# Patient Record
Sex: Male | Born: 1955 | Race: Black or African American | Hispanic: No | Marital: Married
Health system: Southern US, Community
[De-identification: ages and names within clinical notes are randomized; demographics above are authoritative.]

---

## 2020-08-25 ENCOUNTER — Other Ambulatory Visit: Payer: Self-pay

## 2020-08-25 ENCOUNTER — Ambulatory Visit (INDEPENDENT_AMBULATORY_CARE_PROVIDER_SITE_OTHER): Payer: Medicare Other

## 2020-08-25 ENCOUNTER — Encounter: Payer: Self-pay | Admitting: Podiatry

## 2020-08-25 ENCOUNTER — Ambulatory Visit (INDEPENDENT_AMBULATORY_CARE_PROVIDER_SITE_OTHER): Payer: Medicare Other | Admitting: Podiatry

## 2020-08-25 DIAGNOSIS — R52 Pain, unspecified: Secondary | ICD-10-CM

## 2020-08-25 DIAGNOSIS — M7662 Achilles tendinitis, left leg: Secondary | ICD-10-CM

## 2020-08-25 NOTE — Progress Notes (Signed)
Subjective:  Patient ID: Brad Anderson, male    DOB: [birth year],  MRN: [id number]  Chief Complaint  Patient presents with  . Foot Pain    pt states he had pain within left heel and heel for 2-3 months    64 y.o. male presents with the above complaint.  Patient presents with complaint of left posterior heel pain that has been on for 2 to 3 months as progressing on worse.  Patient's tried soaking it cream but none of that has helped.  He states that is very painful to walk on.  Pain on palpation.  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  He has not seen anyone else prior to seeing me.  He denies any other acute complaints.  His pain scale is 10 out of 10.   Review of Systems: Negative except as noted in the HPI. Denies N/V/F/Ch.  No past medical history on file.  Current Outpatient Medications:  .  aspirin 81 MG EC tablet, Take by mouth., Disp: , Rfl:  .  atorvastatin (LIPITOR) 40 MG tablet, Take by mouth., Disp: , Rfl:  .  baclofen (LIORESAL) 10 MG tablet, Take 1 tablet by mouth 3 (three) times daily., Disp: , Rfl:  .  Cholecalciferol 25 MCG (1000 UT) capsule, Take by mouth., Disp: , Rfl:  .  diclofenac (VOLTAREN) 75 MG EC tablet, , Disp: , Rfl:  .  erythromycin ophthalmic ointment, , Disp: , Rfl:  .  glipiZIDE (GLUCOTROL) 10 MG tablet, , Disp: , Rfl:  .  ibuprofen (ADVIL) 800 MG tablet, , Disp: , Rfl:  .  lisinopril (ZESTRIL) 2.5 MG tablet, , Disp: , Rfl:  .  methocarbamol (ROBAXIN) 500 MG tablet, , Disp: , Rfl:  .  pantoprazole (PROTONIX) 40 MG tablet, Take by mouth., Disp: , Rfl:  .  pioglitazone (ACTOS) 15 MG tablet, Take by mouth., Disp: , Rfl:  .  promethazine-dextromethorphan (PROMETHAZINE-DM) 6.25-15 MG/5ML syrup, , Disp: , Rfl:  .  traMADol (ULTRAM) 50 MG tablet, Take by mouth., Disp: , Rfl:   Social History   Tobacco Use  Smoking Status Not on file    Not on File Objective:  There were no vitals filed for this visit. There is no  height or weight on file to calculate BMI. Constitutional Well developed. Well nourished.  Vascular Dorsalis pedis pulses palpable bilaterally. Posterior tibial pulses palpable bilaterally. Capillary refill normal to all digits.  No cyanosis or clubbing noted. Pedal hair growth normal.  Neurologic Normal speech. Oriented to person, place, and time. Epicritic sensation to light touch grossly present bilaterally.  Dermatologic Nails well groomed and normal in appearance. No open wounds. No skin lesions.  Orthopedic:  Pain on palpation to the Achilles insertion.  Pain with dorsiflexion of the ankle joint no pain with plantarflexion of the ankle joint active and passive.  No pain at the peroneal tendon, posterior tibial tendon, ATFL ligament.   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  No osseous fractures noted.  No other bony abnormalities identified. Assessment:   1. Achilles tendinitis, left leg    Plan:  Patient was evaluated and treated and all questions answered.  Left Achilles tendinitis with underlying posterior heel spurring -I explained to patient the etiology of the Achilles tendinitis and various treatment options were discussed.  I believe patient will benefit from cam boot immobilization with a steroid injection to help decrease acute inflammatory component associated  pain.  I discussed with the patient that given that this is good to be in the Kager's fat pad injection there is a risk of rupture associated with it.  Patient would like to proceed despite the risks. -A steroid injection was performed at left Kager's fat pad using 1% plain Lidocaine and 10 mg of Kenalog. This was well tolerated. -He will also benefit from cam boot immobilization.  If there is no improvement we will discuss getting an MRI for possible surgical intervention given the size of the posterior scarring.   No  follow-ups on file.

## 2020-09-22 ENCOUNTER — Encounter: Payer: Self-pay | Admitting: Podiatry

## 2020-09-22 ENCOUNTER — Ambulatory Visit (INDEPENDENT_AMBULATORY_CARE_PROVIDER_SITE_OTHER): Payer: Medicare Other | Admitting: Podiatry

## 2020-09-22 ENCOUNTER — Other Ambulatory Visit: Payer: Self-pay

## 2020-09-22 DIAGNOSIS — M7662 Achilles tendinitis, left leg: Secondary | ICD-10-CM | POA: Diagnosis not present

## 2020-09-22 NOTE — Progress Notes (Signed)
Subjective:  Patient ID: Brad Anderson, male    DOB: 1956-05-20,  MRN: [id number]  Chief Complaint  Patient presents with  . Foot Pain    PT stated that he is doing okay he has no major concerns at this time. He is still having a little bit of pain. He would like another injection     64 y.o. male presents with the above complaint.  Patient presents with a follow-up of left Achilles tendinitis.  Patient states is doing about 80% better.  He would like to know if he can do another steroid injection.  He has been wearing the boot.  He denies any other acute complaints.  Review of Systems: Negative except as noted in the HPI. Denies N/V/F/Ch.  No past medical history on file.  Current Outpatient Medications:  .  aspirin 81 MG EC tablet, Take by mouth., Disp: , Rfl:  .  atorvastatin (LIPITOR) 40 MG tablet, Take by mouth., Disp: , Rfl:  .  baclofen (LIORESAL) 10 MG tablet, Take 1 tablet by mouth 3 (three) times daily., Disp: , Rfl:  .  Cholecalciferol 25 MCG (1000 UT) capsule, Take by mouth., Disp: , Rfl:  .  diclofenac (VOLTAREN) 75 MG EC tablet, , Disp: , Rfl:  .  erythromycin ophthalmic ointment, , Disp: , Rfl:  .  glipiZIDE (GLUCOTROL) 10 MG tablet, , Disp: , Rfl:  .  ibuprofen (ADVIL) 800 MG tablet, , Disp: , Rfl:  .  lisinopril (ZESTRIL) 2.5 MG tablet, , Disp: , Rfl:  .  methocarbamol (ROBAXIN) 500 MG tablet, , Disp: , Rfl:  .  pantoprazole (PROTONIX) 40 MG tablet, Take by mouth., Disp: , Rfl:  .  pioglitazone (ACTOS) 15 MG tablet, Take by mouth., Disp: , Rfl:  .  promethazine-dextromethorphan (PROMETHAZINE-DM) 6.25-15 MG/5ML syrup, , Disp: , Rfl:  .  traMADol (ULTRAM) 50 MG tablet, Take by mouth., Disp: , Rfl:   Social History   Tobacco Use  Smoking Status Not on file  Smokeless Tobacco Not on file    Not on File Objective:  There were no vitals filed for this visit. There is no height or weight on file to calculate BMI. Constitutional Well developed. Well  nourished.  Vascular Dorsalis pedis pulses palpable bilaterally. Posterior tibial pulses palpable bilaterally. Capillary refill normal to all digits.  No cyanosis or clubbing noted. Pedal hair growth normal.  Neurologic Normal speech. Oriented to person, place, and time. Epicritic sensation to light touch grossly present bilaterally.  Dermatologic Nails well groomed and normal in appearance. No open wounds. No skin lesions.  Orthopedic:  Mild pain on palpation to the Achilles insertion.  Mild pain with dorsiflexion of the ankle joint no pain with plantarflexion of the ankle joint active and passive.  No pain at the peroneal tendon, posterior tibial tendon, ATFL ligament.   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  No osseous fractures noted.  No other bony abnormalities identified. Assessment:   No diagnosis found. Plan:  Patient was evaluated and treated and all questions answered.  Left Achilles tendinitis with underlying posterior heel spurring -Clinically improved with 1 steroid injection in cam boot immobilization.  At this point I instructed patient to start transitioning to regular shoes with Tri-Lock ankle brace.  I discussed with the patient that if he continues to have residual pain we can discuss surgical option as well as another steroid injection in 4 weeks.  Patient agrees with the  plan and would like to proceed with the transition with Tri-Lock brace. -Tri-Lock brace was dispensed   No follow-ups on file.

## 2020-10-22 ENCOUNTER — Other Ambulatory Visit: Payer: Self-pay | Admitting: Podiatry

## 2020-10-22 DIAGNOSIS — M7662 Achilles tendinitis, left leg: Secondary | ICD-10-CM

## 2020-10-27 ENCOUNTER — Ambulatory Visit: Payer: Medicare Other | Admitting: Podiatry

## 2020-10-29 ENCOUNTER — Ambulatory Visit: Payer: Medicare Other | Admitting: Podiatry

## 2020-11-03 ENCOUNTER — Ambulatory Visit: Payer: Medicare PPO | Admitting: Podiatry

## 2020-11-03 ENCOUNTER — Other Ambulatory Visit: Payer: Self-pay

## 2020-11-03 DIAGNOSIS — M7662 Achilles tendinitis, left leg: Secondary | ICD-10-CM | POA: Diagnosis not present

## 2020-11-03 DIAGNOSIS — M7732 Calcaneal spur, left foot: Secondary | ICD-10-CM | POA: Diagnosis not present

## 2020-11-04 ENCOUNTER — Encounter: Payer: Self-pay | Admitting: Podiatry

## 2020-11-04 NOTE — Progress Notes (Signed)
Subjective:  Patient ID: Brad Anderson, male    DOB: [birth year],  MRN: [id number]  Chief Complaint  Patient presents with  . Tendonitis    F/u Lt achilles tendonitis -pt states,' pain was getting a little better, but it's going back to how it was." -2/10 with the boot 10/10 w/o the boot -worse with trilock brace - pt is back on the boot tx; boot, icing, and epsom salt     65 y.o. male presents with the above complaint.  Patient presents with a follow-up of left Achilles tendinitis.  Patient states that this has regressed and started getting painful again.  Patient states with the boot he has no pain without the boot he is not able to transition with the brace.  He states is very painful to touch.  He states that he would still like to discuss conservative treatment options however if this does not help we will definitely to proceed with surgical intervention given the amount of pain that he is having.  Denies any other acute complaints  Review of Systems: Negative except as noted in the HPI. Denies N/V/F/Ch.  No past medical history on file.  Current Outpatient Medications:  .  aspirin 81 MG EC tablet, Take by mouth., Disp: , Rfl:  .  atorvastatin (LIPITOR) 40 MG tablet, Take by mouth., Disp: , Rfl:  .  baclofen (LIORESAL) 10 MG tablet, Take 1 tablet by mouth 3 (three) times daily., Disp: , Rfl:  .  Cholecalciferol 25 MCG (1000 UT) capsule, Take by mouth., Disp: , Rfl:  .  diclofenac (VOLTAREN) 75 MG EC tablet, , Disp: , Rfl:  .  erythromycin ophthalmic ointment, , Disp: , Rfl:  .  glipiZIDE (GLUCOTROL) 10 MG tablet, , Disp: , Rfl:  .  ibuprofen (ADVIL) 800 MG tablet, , Disp: , Rfl:  .  lisinopril (ZESTRIL) 2.5 MG tablet, , Disp: , Rfl:  .  methocarbamol (ROBAXIN) 500 MG tablet, , Disp: , Rfl:  .  pantoprazole (PROTONIX) 40 MG tablet, Take by mouth., Disp: , Rfl:  .  pioglitazone (ACTOS) 15 MG tablet, Take by mouth., Disp: , Rfl:  .  promethazine-dextromethorphan (PROMETHAZINE-DM)  6.25-15 MG/5ML syrup, , Disp: , Rfl:  .  traMADol (ULTRAM) 50 MG tablet, Take by mouth., Disp: , Rfl:   Social History   Tobacco Use  Smoking Status Not on file  Smokeless Tobacco Not on file    Allergies  Allergen Reactions  . 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 Naproxen Rash   Objective:  There were no vitals filed for this visit. There is no height or weight on file to calculate BMI. Constitutional Well developed. Well nourished.  Vascular Dorsalis pedis pulses palpable bilaterally. Posterior tibial pulses palpable bilaterally. Capillary refill normal to all digits.  No cyanosis or clubbing noted. Pedal hair growth normal.  Neurologic Normal speech. Oriented to person, place, and time. Epicritic sensation to light touch grossly present bilaterally.  Dermatologic Nails well groomed and normal in appearance. No open wounds. No skin lesions.  Orthopedic:  pain on palpation to the Achilles insertion.  pain with dorsiflexion of the ankle joint no pain with plantarflexion of the ankle joint active and passive.  No pain at the peroneal tendon, posterior tibial tendon, ATFL ligament.   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  No osseous fractures noted.  No other bony  abnormalities identified. Assessment:   1. Achilles tendinitis, left leg   2. Heel spur, left    Plan:  Patient was evaluated and treated and all questions answered.  Left Achilles tendinitis with underlying posterior heel spurring -Clinically patient was not able to transition from cam boot into regular shoes without relief.  Given that I have given the steroid shot over few months ago I believe he can benefit from 1 more steroid injection.  I discussed the risk of rupture associated with steroid shot in the Kager's fat pad.  Patient would like to proceed  despite the risks.  At this point this will be the last steroid shot in the posterior heel if there is no improvement we will need to proceed with surgical intervention.  I discussed this with the patient extensive detail he states understanding and will proceed with surgery if this does not help. -New cam boot was dispensed as his previous one was worn out. -A steroid injection was performed at left Kager's fat pad using 1% plain Lidocaine and 10 mg of Kenalog. This was well tolerated.    No follow-ups on file.

## 2020-12-08 ENCOUNTER — Other Ambulatory Visit: Payer: Self-pay

## 2020-12-08 ENCOUNTER — Ambulatory Visit: Payer: Medicare PPO | Admitting: Podiatry

## 2020-12-08 DIAGNOSIS — M7662 Achilles tendinitis, left leg: Secondary | ICD-10-CM

## 2020-12-08 DIAGNOSIS — M21862 Other specified acquired deformities of left lower leg: Secondary | ICD-10-CM

## 2020-12-08 DIAGNOSIS — Z01818 Encounter for other preprocedural examination: Secondary | ICD-10-CM

## 2020-12-08 DIAGNOSIS — M216X2 Other acquired deformities of left foot: Secondary | ICD-10-CM | POA: Diagnosis not present

## 2020-12-10 ENCOUNTER — Encounter: Payer: Self-pay | Admitting: Podiatry

## 2020-12-10 NOTE — Progress Notes (Signed)
Subjective:  Patient ID: Brad Anderson, male    DOB: 01-11-1956,  MRN: [id number]  Chief Complaint  Patient presents with  . Foot Pain    PT stated the heel pain is better but the pain is more in the back of the leg he stated it feels like a pulled muscle.    65 y.o. male presents with the above complaint.  Patient presents with a follow-up of left Achilles tendinitis.  Patient states the injection helped considerably.  The boot also helped considerably all of it together made his pain get 100% better.  She he states that he does have secondary complaint of tightness in the Achilles that does not seem to get better.  He is tried stretching it is tried all conservative treatment options are available but he just feels so tired that could have led to the Achilles tendon as well as the underlying flatfoot.  I discussed with the patient that he could try the oil lifts which he did but has not helped.  At this time he would like to discuss surgical options at length with the Achilles including left ankle gastrocnemius recession.  Review of Systems: Negative except as noted in the HPI. Denies N/V/F/Ch.  No past medical history on file.  Current Outpatient Medications:  .  aspirin 81 MG EC tablet, Take by mouth., Disp: , Rfl:  .  atorvastatin (LIPITOR) 40 MG tablet, Take by mouth., Disp: , Rfl:  .  baclofen (LIORESAL) 10 MG tablet, Take 1 tablet by mouth 3 (three) times daily., Disp: , Rfl:  .  Cholecalciferol 25 MCG (1000 UT) capsule, Take by mouth., Disp: , Rfl:  .  diclofenac (VOLTAREN) 75 MG EC tablet, , Disp: , Rfl:  .  erythromycin ophthalmic ointment, , Disp: , Rfl:  .  glipiZIDE (GLUCOTROL) 10 MG tablet, , Disp: , Rfl:  .  ibuprofen (ADVIL) 800 MG tablet, , Disp: , Rfl:  .  lisinopril (ZESTRIL) 2.5 MG tablet, , Disp: , Rfl:  .  methocarbamol (ROBAXIN) 500 MG tablet, , Disp: , Rfl:  .  pantoprazole (PROTONIX) 40 MG tablet, Take by mouth., Disp: , Rfl:  .  pioglitazone (ACTOS) 15 MG  tablet, Take by mouth., Disp: , Rfl:  .  promethazine-dextromethorphan (PROMETHAZINE-DM) 6.25-15 MG/5ML syrup, , Disp: , Rfl:  .  traMADol (ULTRAM) 50 MG tablet, Take by mouth., Disp: , Rfl:   Social History   Tobacco Use  Smoking Status Not on file  Smokeless Tobacco Not on file    Allergies  Allergen Reactions  . 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 Naproxen Rash   Objective:  There were no vitals filed for this visit. There is no height or weight on file to calculate BMI. Constitutional Well developed. Well nourished.  Vascular Dorsalis pedis pulses palpable bilaterally. Posterior tibial pulses palpable bilaterally. Capillary refill normal to all digits.  No cyanosis or clubbing noted. Pedal hair growth normal.  Neurologic Normal speech. Oriented to person, place, and time. Epicritic sensation to light touch grossly present bilaterally.  Dermatologic Nails well groomed and normal in appearance. No open wounds. No skin lesions.  Orthopedic:  No pain on palpation to the Achilles insertion.  pain with dorsiflexion of the ankle joint no pain with plantarflexion of the ankle joint active and passive.  No pain at the peroneal tendon, posterior tibial tendon, ATFL ligament.  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  No osseous fractures noted.  No other bony abnormalities identified. Assessment:   1. Gastrocnemius equinus of left lower extremity   2. Achilles tendinitis, left leg   3. Preoperative examination    Plan:  Patient was evaluated and treated and all questions answered.  Left Achilles tendinitis with underlying posterior heel spurring -Clinically resolving with cam boot immobilization and steroid shot.    Gastrocnemius equinus left painful/tight -I  explained the patient the etiology of gastrocnemius equinus and its relationship with the Achilles tendinitis and the driving force behind it.  I discussed with the patient that even though his Achilles tendinitis has resolved the driving force behind it was a tight Achilles tendon.  I believe she will benefit from gastrocnemius recession I discussed that given that he has failed all conservative treatment options including stretching padding shoe gear modification I believe patient is an ideal candidate for gastrocnemius recession to allow the soft tissue to stretch and compensate for the foot.  I discussed with the patient that he will benefit from gastrocnemius recession procedure/Strayer procedure.  He states understand would like to proceed with the surgery -He will be weightbearing as tolerated in a cam boot. -Informed surgical risk consent was reviewed and read aloud to the patient.  I reviewed the films.  I have discussed my findings with the patient in great detail.  I have discussed all risks including but not limited to infection, stiffness, scarring, limp, disability, deformity, damage to blood vessels and nerves, numbness, poor healing, need for braces, arthritis, chronic pain, amputation, death.  All benefits and realistic expectations discussed in great detail.  I have made no promises as to the outcome.  I have provided realistic expectations.  I have offered the patient a 2nd opinion, which they have declined and assured me they preferred to proceed despite the risks -A total of 29 minutes was spent in direct patient care as well as pre and post patient encounter activities.  This includes documentation as well as reviewing patient chart for labs, imaging, past medical, surgical, social, and family history as documented in the EMR.  I have reviewed medication allergies as documented in EMR.  I discussed the etiology of condition and treatment options from conservative to surgical care.  All risks  and benefit of the treatment course was discussed in detail.  All questions were answered and return appointment was discussed.  Since the visit completed in an ambulatory/outpatient setting, the patient and/or parent/guardian has been advised to contact the providers office for worsening condition and seek medical treatment and/or call 911 if the patient deems either is necessary.     No follow-ups on file.

## 2020-12-31 ENCOUNTER — Other Ambulatory Visit: Payer: Self-pay

## 2020-12-31 ENCOUNTER — Ambulatory Visit: Payer: Medicare PPO | Admitting: Podiatry

## 2020-12-31 DIAGNOSIS — R202 Paresthesia of skin: Secondary | ICD-10-CM

## 2020-12-31 DIAGNOSIS — M216X2 Other acquired deformities of left foot: Secondary | ICD-10-CM

## 2020-12-31 DIAGNOSIS — R200 Anesthesia of skin: Secondary | ICD-10-CM | POA: Diagnosis not present

## 2020-12-31 DIAGNOSIS — M21862 Other specified acquired deformities of left lower leg: Secondary | ICD-10-CM

## 2020-12-31 DIAGNOSIS — M7662 Achilles tendinitis, left leg: Secondary | ICD-10-CM | POA: Diagnosis not present

## 2021-01-04 ENCOUNTER — Telehealth: Payer: Self-pay | Admitting: Urology

## 2021-01-04 NOTE — Telephone Encounter (Signed)
DOS - 01/17/21  GASTROCNEMIUS RECESS - LEFT --- 62263  PER [employer] ([employer]) FOR CPT CODE 33545 NO PRIOR AUTH IS REQUIRED.

## 2021-01-05 ENCOUNTER — Encounter: Payer: Self-pay | Admitting: Podiatry

## 2021-01-05 NOTE — Progress Notes (Signed)
Subjective:  Patient ID: Brad Anderson, male    DOB: 12-May-1956,  MRN: [id number]  Chief Complaint  Patient presents with  . Foot Pain    Pt stated that he is doing okay he is concerned about the burning sensation in his leg     65 y.o. male presents with the above complaint.  Patient presents with a follow-up of left Achilles tendinitis.  Patient states the injection helped considerably.  The boot also helped considerably all of it together made his pain get 100% better.  She he states that he does have secondary complaint of tightness in the Achilles that does not seem to get better.  He is tried stretching it is tried all conservative treatment options are available but he just feels so tired that could have led to the Achilles tendon as well as the underlying flatfoot.  I discussed with the patient that he could try the oil lifts which he did but has not helped.  At this time he would like to discuss surgical options at length with the Achilles including left ankle gastrocnemius recession.  In addition to the listed above procedure patient is here just to get evaluation of numbness tingling that happened all of a sudden and just wants to get it evaluated.  He has not tried anything for it.  He has not done any treatment options for it.  He just wanted to make sure this is similar to the tight Achilles tendon.  Review of Systems: Negative except as noted in the HPI. Denies N/V/F/Ch.  No past medical history on file.  Current Outpatient Medications:  .  aspirin 81 MG EC tablet, Take by mouth., Disp: , Rfl:  .  atorvastatin (LIPITOR) 40 MG tablet, Take by mouth., Disp: , Rfl:  .  baclofen (LIORESAL) 10 MG tablet, Take 1 tablet by mouth 3 (three) times daily., Disp: , Rfl:  .  Cholecalciferol 25 MCG (1000 UT) capsule, Take by mouth., Disp: , Rfl:  .  diclofenac (VOLTAREN) 75 MG EC tablet, , Disp: , Rfl:  .  erythromycin ophthalmic ointment, , Disp: , Rfl:  .  glipiZIDE (GLUCOTROL) 10 MG  tablet, , Disp: , Rfl:  .  ibuprofen (ADVIL) 800 MG tablet, , Disp: , Rfl:  .  lisinopril (ZESTRIL) 2.5 MG tablet, , Disp: , Rfl:  .  methocarbamol (ROBAXIN) 500 MG tablet, , Disp: , Rfl:  .  pantoprazole (PROTONIX) 40 MG tablet, Take by mouth., Disp: , Rfl:  .  pioglitazone (ACTOS) 15 MG tablet, Take by mouth., Disp: , Rfl:  .  promethazine-dextromethorphan (PROMETHAZINE-DM) 6.25-15 MG/5ML syrup, , Disp: , Rfl:  .  traMADol (ULTRAM) 50 MG tablet, Take by mouth., Disp: , Rfl:   Social History   Tobacco Use  Smoking Status Not on file  Smokeless Tobacco Not on file    Allergies  Allergen Reactions  . 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 Naproxen Rash   Objective:  There were no vitals filed for this visit. There is no height or weight on file to calculate BMI. Constitutional Well developed. Well nourished.  Vascular Dorsalis pedis pulses palpable bilaterally. Posterior tibial pulses palpable bilaterally. Capillary refill normal to all digits.  No cyanosis or clubbing noted. Pedal hair growth normal.  Neurologic Normal speech. Oriented to person, place, and time. Epicritic sensation to light touch grossly present bilaterally.  Subjective numbness tingling without compression noted.  Negative Tinel's sign.  Dermatologic Nails well groomed and normal in appearance. No open wounds. No skin lesions.  Orthopedic:  No pain on palpation to the Achilles insertion.  pain with dorsiflexion of the ankle joint no pain with plantarflexion of the ankle joint active and passive.  No pain at the peroneal tendon, posterior tibial tendon, ATFL ligament.  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  No osseous fractures noted.  No other bony abnormalities identified. Assessment:   1.  Gastrocnemius equinus of left lower extremity   2. Achilles tendinitis, left leg    Plan:  Patient was evaluated and treated and all questions answered.  Left Achilles tendinitis with underlying posterior heel spurring -Clinically resolving with cam boot immobilization and steroid shot.    Numbness tingling -I explained to the patient the etiology of numbness tingling especially to the left side.  Is likely due to just sural nerve compression from tight gastroc.  I discussed with the patient that this may resolve after the surgery.  Gastrocnemius equinus left painful/tight -I explained the patient the etiology of gastrocnemius equinus and its relationship with the Achilles tendinitis and the driving force behind it.  I discussed with the patient that even though his Achilles tendinitis has resolved the driving force behind it was a tight Achilles tendon.  I believe she will benefit from gastrocnemius recession I discussed that given that he has failed all conservative treatment options including stretching padding shoe gear modification I believe patient is an ideal candidate for gastrocnemius recession to allow the soft tissue to stretch and compensate for the foot.  I discussed with the patient that he will benefit from gastrocnemius recession procedure/Strayer procedure.  He states understand would like to proceed with the surgery -He will be weightbearing as tolerated in a cam boot. -Informed surgical risk consent was reviewed and read aloud to the patient.  I reviewed the films.  I have discussed my findings with the patient in great detail.  I have discussed all risks including but not limited to infection, stiffness, scarring, limp, disability, deformity, damage to blood vessels and nerves, numbness, poor healing, need for braces, arthritis, chronic pain, amputation, death.  All benefits and realistic expectations discussed in great detail.  I have made no promises as to the outcome.  I have  provided realistic expectations.  I have offered the patient a 2nd opinion, which they have declined and assured me they preferred to proceed despite the risks -A total of 29 minutes was spent in direct patient care as well as pre and post patient encounter activities.  This includes documentation as well as reviewing patient chart for labs, imaging, past medical, surgical, social, and family history as documented in the EMR.  I have reviewed medication allergies as documented in EMR.  I discussed the etiology of condition and treatment options from conservative to surgical care.  All risks and benefit of the treatment course was discussed in detail.  All questions were answered and return appointment was discussed.  Since the visit completed in an ambulatory/outpatient setting, the patient and/or parent/guardian has been advised to contact the providers office for worsening condition and seek medical treatment and/or call 911 if the patient deems either is necessary.     No follow-ups on file.

## 2021-01-17 ENCOUNTER — Other Ambulatory Visit: Payer: Self-pay | Admitting: Podiatry

## 2021-01-17 ENCOUNTER — Encounter: Payer: Self-pay | Admitting: Podiatry

## 2021-01-17 DIAGNOSIS — M216X2 Other acquired deformities of left foot: Secondary | ICD-10-CM

## 2021-01-17 MED ORDER — IBUPROFEN 800 MG PO TABS: 800.0000 mg | ORAL_TABLET | Freq: Four times a day (QID) | ORAL | 1 refills | Status: AC | PRN

## 2021-01-17 MED ORDER — OXYCODONE-ACETAMINOPHEN 5-325 MG PO TABS: 1.0000 | ORAL_TABLET | ORAL | 0 refills | Status: AC | PRN

## 2021-01-18 ENCOUNTER — Telehealth: Payer: Self-pay | Admitting: *Deleted

## 2021-01-18 ENCOUNTER — Telehealth: Payer: Self-pay | Admitting: Podiatry

## 2021-01-18 MED ORDER — OXYCODONE-ACETAMINOPHEN 5-325 MG PO TABS: 1.0000 | ORAL_TABLET | ORAL | 0 refills | Status: AC | PRN

## 2021-01-18 NOTE — Telephone Encounter (Signed)
I called the pharmacy to get clarity on what was going on. The patient misinterpreted what the pharmacy told him which made me think prior auth was needed. The pharmacy actually told him the dose is too high which is why they are unable to release the medication to the patient. Please review dosage and rewrite prescription.

## 2021-01-18 NOTE — Telephone Encounter (Signed)
Prior Brad Anderson is needed. Please advise

## 2021-01-18 NOTE — Addendum Note (Signed)
Addended by: Nicholes Rough on: 01/18/2021 10:30 AM   Modules accepted: Orders

## 2021-01-18 NOTE — Telephone Encounter (Signed)
Can you give call to the pharmacist to see what that means by authorized to release.  If they just needed verbal then go ahead and give the authorized release as patient had surgery done.  Thank you

## 2021-01-18 NOTE — Telephone Encounter (Signed)
Patient calling in to inform Dr. Allena Katz that he is unable to get is medications prescribed after surgery. Patient states that the pharmacy is requesting "authorized release" in order for the patient to get his medications. Please advise.

## 2021-01-18 NOTE — Telephone Encounter (Signed)
Completed by Waverly Ferrari

## 2021-01-18 NOTE — Telephone Encounter (Signed)
Review chart for notes

## 2021-01-26 ENCOUNTER — Ambulatory Visit (INDEPENDENT_AMBULATORY_CARE_PROVIDER_SITE_OTHER): Payer: Medicare PPO | Admitting: Podiatry

## 2021-01-26 ENCOUNTER — Encounter: Payer: Self-pay | Admitting: Podiatry

## 2021-01-26 ENCOUNTER — Other Ambulatory Visit: Payer: Self-pay

## 2021-01-26 DIAGNOSIS — M216X2 Other acquired deformities of left foot: Secondary | ICD-10-CM | POA: Diagnosis not present

## 2021-01-26 DIAGNOSIS — Z9889 Other specified postprocedural states: Secondary | ICD-10-CM

## 2021-01-26 DIAGNOSIS — M21862 Other specified acquired deformities of left lower leg: Secondary | ICD-10-CM

## 2021-01-26 DIAGNOSIS — M7662 Achilles tendinitis, left leg: Secondary | ICD-10-CM | POA: Diagnosis not present

## 2021-01-26 MED ORDER — OXYCODONE-ACETAMINOPHEN 10-325 MG PO TABS: 1.0000 | ORAL_TABLET | ORAL | 0 refills | Status: AC | PRN

## 2021-01-26 NOTE — Progress Notes (Signed)
Subjective:  Patient ID: Brad Anderson, male    DOB: 11-01-1955,  MRN: [id number]  Chief Complaint  Patient presents with  . Routine Post Op    POV #1 -pt denies NV/Fch -dressing clean and intact -pt states," pain is about a 3/10, but doing fine." - Tx: narco, boot and elevation      65 y.o. male returns for post-op check. Patient states he is doing well.  He is a s/p gastrocnemius recession to the left side.  Surgical date of 01/17/2021.  His pain is pretty well controlled on Percocet.  He states that he is ambulating with a cam boot.  He would like to know if he can get a Tri-Lock ankle brace to help with the transition to regular shoes.  He denies any other acute complaints  Review of Systems: Negative except as noted in the HPI. Denies N/V/F/Ch.  No past medical history on file.  Current Outpatient Medications:  .  oxyCODONE-acetaminophen (PERCOCET) 10-325 MG tablet, Take 1 tablet by mouth every 4 (four) hours as needed for pain., Disp: 30 tablet, Rfl: 0 .  aspirin 81 MG EC tablet, Take by mouth., Disp: , Rfl:  .  atorvastatin (LIPITOR) 40 MG tablet, Take by mouth., Disp: , Rfl:  .  baclofen (LIORESAL) 10 MG tablet, Take 1 tablet by mouth 3 (three) times daily., Disp: , Rfl:  .  Cholecalciferol 25 MCG (1000 UT) capsule, Take by mouth., Disp: , Rfl:  .  diclofenac (VOLTAREN) 75 MG EC tablet, , Disp: , Rfl:  .  erythromycin ophthalmic ointment, , Disp: , Rfl:  .  glipiZIDE (GLUCOTROL) 10 MG tablet, , Disp: , Rfl:  .  ibuprofen (ADVIL) 800 MG tablet, , Disp: , Rfl:  .  ibuprofen (ADVIL) 800 MG tablet, Take 1 tablet (800 mg total) by mouth every 6 (six) hours as needed., Disp: 60 tablet, Rfl: 1 .  lisinopril (ZESTRIL) 2.5 MG tablet, , Disp: , Rfl:  .  methocarbamol (ROBAXIN) 500 MG tablet, , Disp: , Rfl:  .  oxyCODONE-acetaminophen (PERCOCET) 5-325 MG tablet, Take 1-2 tablets by mouth every 4 (four) hours as needed for severe pain., Disp: 30 tablet, Rfl: 0 .   oxyCODONE-acetaminophen (PERCOCET) 5-325 MG tablet, Take 1 tablet by mouth every 4 (four) hours as needed for severe pain., Disp: 30 tablet, Rfl: 0 .  pantoprazole (PROTONIX) 40 MG tablet, Take by mouth., Disp: , Rfl:  .  pioglitazone (ACTOS) 15 MG tablet, Take by mouth., Disp: , Rfl:  .  promethazine-dextromethorphan (PROMETHAZINE-DM) 6.25-15 MG/5ML syrup, , Disp: , Rfl:  .  traMADol (ULTRAM) 50 MG tablet, Take by mouth., Disp: , Rfl:   Social History   Tobacco Use  Smoking Status Not on file  Smokeless Tobacco Not on file    Allergies  Allergen Reactions  . 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 Naproxen Rash   Objective:  There were no vitals filed for this visit. There is no height or weight on file to calculate BMI. Constitutional Well developed. Well nourished.  Vascular Foot warm and well perfused. Capillary refill normal to all digits.   Neurologic Normal speech. Oriented to person, place, and time. Epicritic sensation to light touch grossly present bilaterally.  Dermatologic Skin healing well without signs of infection. Skin edges well coapted without signs of infection.  Orthopedic: Tenderness to palpation noted about the surgical site.   Radiographs: None Assessment:   1. Gastrocnemius  equinus of left lower extremity   2. Achilles tendinitis, left leg   3. Status post foot surgery    Plan:  Patient was evaluated and treated and all questions answered.  S/p foot surgery left -Progressing as expected post-operatively. -XR: None -WB Status: Weightbearing as tolerated in in cam boot however can begin transition to regular shoes with Tri-Lock ankle brace Tri-Lock ankle brace was dispensed -Sutures: Intact.  No clinical signs of dehiscence noted.  No complication noted.  No redness noted -Medications: Percocet for pain control -Foot redressed.  No follow-ups on file.

## 2021-02-09 ENCOUNTER — Ambulatory Visit (INDEPENDENT_AMBULATORY_CARE_PROVIDER_SITE_OTHER): Payer: Medicare PPO | Admitting: Podiatry

## 2021-02-09 ENCOUNTER — Encounter: Payer: Self-pay | Admitting: Podiatry

## 2021-02-09 ENCOUNTER — Other Ambulatory Visit: Payer: Self-pay

## 2021-02-09 DIAGNOSIS — Z9889 Other specified postprocedural states: Secondary | ICD-10-CM

## 2021-02-09 DIAGNOSIS — M216X2 Other acquired deformities of left foot: Secondary | ICD-10-CM

## 2021-02-09 DIAGNOSIS — M21862 Other specified acquired deformities of left lower leg: Secondary | ICD-10-CM

## 2021-02-11 ENCOUNTER — Encounter: Payer: Self-pay | Admitting: Podiatry

## 2021-02-11 NOTE — Progress Notes (Signed)
Subjective:  Patient ID: Brad Anderson, male    DOB: 01/24/1956,  MRN: [id number]  Chief Complaint  Patient presents with  . Routine Post Op    POST OP      65 y.o. male returns for post-op check. Patient states he is doing well.  He is a s/p gastrocnemius recession to the left side.  Surgical date of 01/17/2021.  His pain is pretty well controlled on Percocet.  He states that he is ambulating with a cam boot.  He would like to know if he can get a Tri-Lock ankle brace to help with the transition to regular shoes.  He denies any other acute complaints  Review of Systems: Negative except as noted in the HPI. Denies N/V/F/Ch.  No past medical history on file.  Current Outpatient Medications:  .  aspirin 81 MG EC tablet, Take by mouth., Disp: , Rfl:  .  atorvastatin (LIPITOR) 40 MG tablet, Take by mouth., Disp: , Rfl:  .  baclofen (LIORESAL) 10 MG tablet, Take 1 tablet by mouth 3 (three) times daily., Disp: , Rfl:  .  Cholecalciferol 25 MCG (1000 UT) capsule, Take by mouth., Disp: , Rfl:  .  diclofenac (VOLTAREN) 75 MG EC tablet, , Disp: , Rfl:  .  erythromycin ophthalmic ointment, , Disp: , Rfl:  .  glipiZIDE (GLUCOTROL) 10 MG tablet, , Disp: , Rfl:  .  ibuprofen (ADVIL) 800 MG tablet, , Disp: , Rfl:  .  ibuprofen (ADVIL) 800 MG tablet, Take 1 tablet (800 mg total) by mouth every 6 (six) hours as needed., Disp: 60 tablet, Rfl: 1 .  lisinopril (ZESTRIL) 2.5 MG tablet, , Disp: , Rfl:  .  methocarbamol (ROBAXIN) 500 MG tablet, , Disp: , Rfl:  .  oxyCODONE-acetaminophen (PERCOCET) 10-325 MG tablet, Take 1 tablet by mouth every 4 (four) hours as needed for pain., Disp: 30 tablet, Rfl: 0 .  oxyCODONE-acetaminophen (PERCOCET) 5-325 MG tablet, Take 1-2 tablets by mouth every 4 (four) hours as needed for severe pain., Disp: 30 tablet, Rfl: 0 .  oxyCODONE-acetaminophen (PERCOCET) 5-325 MG tablet, Take 1 tablet by mouth every 4 (four) hours as needed for severe pain., Disp: 30 tablet, Rfl: 0 .   pantoprazole (PROTONIX) 40 MG tablet, Take by mouth., Disp: , Rfl:  .  pioglitazone (ACTOS) 15 MG tablet, Take by mouth., Disp: , Rfl:  .  promethazine-dextromethorphan (PROMETHAZINE-DM) 6.25-15 MG/5ML syrup, , Disp: , Rfl:  .  traMADol (ULTRAM) 50 MG tablet, Take by mouth., Disp: , Rfl:   Social History   Tobacco Use  Smoking Status Not on file  Smokeless Tobacco Not on file    Allergies  Allergen Reactions  . 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 Naproxen Rash   Objective:  There were no vitals filed for this visit. There is no height or weight on file to calculate BMI. Constitutional Well developed. Well nourished.  Vascular Foot warm and well perfused. Capillary refill normal to all digits.   Neurologic Normal speech. Oriented to person, place, and time. Epicritic sensation to light touch grossly present bilaterally.  Dermatologic  skin completely epithelialized.  Ankle range of motion 10 degrees past 90.  Increase in ankle range of motion since preoperatively.  Orthopedic:  No tenderness to palpation noted about the surgical site.   Radiographs: None Assessment:   1. Gastrocnemius equinus of left lower extremity   2. Status post foot surgery  Plan:  Patient was evaluated and treated and all questions answered.  S/p foot surgery left -Progressing as expected post-operatively. -XR: None -WB Status: Weight-bear as tolerated in regular shoes -Sutures: None -Medications: None -Clinically patient is healing really well.  At this time patient is officially discharged from my care.  He has increased his range of motion is comparatively to preoperative.  He would like to do physical therapy at home.  If any foot and ankle issues arises he will come back and see me.  No follow-ups on file.

## 2021-02-16 NOTE — Telephone Encounter (Signed)
Completed note 

## 2021-03-23 ENCOUNTER — Other Ambulatory Visit: Payer: Self-pay

## 2021-03-23 ENCOUNTER — Encounter: Payer: Self-pay | Admitting: Podiatry

## 2021-03-23 ENCOUNTER — Ambulatory Visit (INDEPENDENT_AMBULATORY_CARE_PROVIDER_SITE_OTHER): Payer: Medicare PPO | Admitting: Podiatry

## 2021-03-23 DIAGNOSIS — M21862 Other specified acquired deformities of left lower leg: Secondary | ICD-10-CM

## 2021-03-23 DIAGNOSIS — M216X2 Other acquired deformities of left foot: Secondary | ICD-10-CM

## 2021-03-23 DIAGNOSIS — M7662 Achilles tendinitis, left leg: Secondary | ICD-10-CM

## 2021-03-23 DIAGNOSIS — M62462 Contracture of muscle, left lower leg: Secondary | ICD-10-CM

## 2021-03-23 DIAGNOSIS — Z9889 Other specified postprocedural states: Secondary | ICD-10-CM

## 2021-03-24 ENCOUNTER — Encounter: Payer: Self-pay | Admitting: Podiatry

## 2021-03-24 NOTE — Progress Notes (Signed)
Subjective:  Patient ID: Brad Anderson, male    DOB: 04-17-1956,  MRN: [id number]  Chief Complaint  Patient presents with   Routine Post Op    POST OP DOS 4.11.22      65 y.o. male returns for post-op check. Patient states he is doing well.  He is a s/p gastrocnemius recession to the left side.  Surgical date of 01/17/2021.  His pain is pretty well controlled on Percocet.  He is ambulating without bracing and with sneakers on at this time.  Review of Systems: Negative except as noted in the HPI. Denies N/V/F/Ch.  History reviewed. No pertinent past medical history.  Current Outpatient Medications:    aspirin 81 MG EC tablet, Take by mouth., Disp: , Rfl:    atorvastatin (LIPITOR) 40 MG tablet, Take by mouth., Disp: , Rfl:    baclofen (LIORESAL) 10 MG tablet, Take 1 tablet by mouth 3 (three) times daily., Disp: , Rfl:    Cholecalciferol 25 MCG (1000 UT) capsule, Take by mouth., Disp: , Rfl:    diclofenac (VOLTAREN) 75 MG EC tablet, , Disp: , Rfl:    erythromycin ophthalmic ointment, , Disp: , Rfl:    glipiZIDE (GLUCOTROL) 10 MG tablet, , Disp: , Rfl:    ibuprofen (ADVIL) 800 MG tablet, , Disp: , Rfl:    ibuprofen (ADVIL) 800 MG tablet, Take 1 tablet (800 mg total) by mouth every 6 (six) hours as needed., Disp: 60 tablet, Rfl: 1   lisinopril (ZESTRIL) 2.5 MG tablet, , Disp: , Rfl:    methocarbamol (ROBAXIN) 500 MG tablet, , Disp: , Rfl:    oxyCODONE-acetaminophen (PERCOCET) 10-325 MG tablet, Take 1 tablet by mouth every 4 (four) hours as needed for pain., Disp: 30 tablet, Rfl: 0   oxyCODONE-acetaminophen (PERCOCET) 5-325 MG tablet, Take 1-2 tablets by mouth every 4 (four) hours as needed for severe pain., Disp: 30 tablet, Rfl: 0   oxyCODONE-acetaminophen (PERCOCET) 5-325 MG tablet, Take 1 tablet by mouth every 4 (four) hours as needed for severe pain., Disp: 30 tablet, Rfl: 0   pantoprazole (PROTONIX) 40 MG tablet, Take by mouth., Disp: , Rfl:    pioglitazone (ACTOS) 15 MG tablet,  Take by mouth., Disp: , Rfl:    promethazine-dextromethorphan (PROMETHAZINE-DM) 6.25-15 MG/5ML syrup, , Disp: , Rfl:    traMADol (ULTRAM) 50 MG tablet, Take by mouth., Disp: , Rfl:   Social History   Tobacco Use  Smoking Status Not on file  Smokeless Tobacco Not on file    Allergies  Allergen Reactions   Metformin Other (See Comments)    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 There is no height or weight on file to calculate BMI. Constitutional Well developed. Well nourished.  Vascular Foot warm and well perfused. Capillary refill normal to all digits.   Neurologic Normal speech. Oriented to person, place, and time. Epicritic sensation to light touch grossly present bilaterally.  Dermatologic  skin completely epithelialized.  Ankle range of motion 10 degrees past 90.  Increase in ankle range of motion since preoperatively.  Orthopedic:  No tenderness to palpation noted about the surgical site.   Radiographs: None Assessment:   1. Gastrocnemius equinus of left lower extremity   2. Status post foot surgery   3. Achilles tendinitis, left leg     Plan:  Patient was evaluated and treated and all questions answered.  S/p foot surgery  left -Progressing as expected post-operatively. -XR: None -WB Status: Weight-bear as tolerated in regular shoes -Sutures: None -Medications: None -Clinically patient is healing really well.  At this time patient is officially discharged from my care.  He has increased his range of motion is comparatively to preoperative.  He would like to do physical therapy at home.  If any foot and ankle issues arises he will come back and see me.  No follow-ups on file.

## 2021-08-31 ENCOUNTER — Ambulatory Visit: Payer: Medicare PPO | Admitting: Podiatry

## 2021-08-31 ENCOUNTER — Other Ambulatory Visit: Payer: Self-pay

## 2021-08-31 DIAGNOSIS — M7662 Achilles tendinitis, left leg: Secondary | ICD-10-CM

## 2021-08-31 DIAGNOSIS — M7752 Other enthesopathy of left foot: Secondary | ICD-10-CM | POA: Diagnosis not present

## 2021-08-31 MED ORDER — MELOXICAM 15 MG PO TABS: 15.0000 mg | ORAL_TABLET | Freq: Every day | ORAL | 0 refills | Status: AC

## 2021-09-06 ENCOUNTER — Encounter: Payer: Self-pay | Admitting: Podiatry

## 2021-09-06 NOTE — Progress Notes (Signed)
Subjective:  Patient ID: Brad Anderson, male    DOB: 1955-11-11,  MRN: [id number]  Chief Complaint  Patient presents with   Foot Pain    Left foot back of ankle PT stated that he feels like there is fluid and it has been there for about a month and it is painful     65 y.o. male presents with the above complaint.  Patient presents with new complaint of left posterior Achilles bursa.  Patient states there is a fluidlike mass in the back of the leg.  Patient has been going for about a month is progressively painful.  He would like to discuss treatment options for this.  Hurts with ambulation.  He does not recall any kind of popping sound or any kind of tearing sound.  He does not have any pain at the surgical sites.  It has progressed to gotten worse.  He did get some new shoes that are hightops.  He would like to discuss treatment options for this.  He has not placed himself in the boot   Review of Systems: Negative except as noted in the HPI. Denies N/V/F/Ch.  History reviewed. No pertinent past medical history.  Current Outpatient Medications:    meloxicam (MOBIC) 15 MG tablet, Take 1 tablet (15 mg total) by mouth daily., Disp: 30 tablet, Rfl: 0   aspirin 81 MG EC tablet, Take by mouth., Disp: , Rfl:    atorvastatin (LIPITOR) 40 MG tablet, Take by mouth., Disp: , Rfl:    baclofen (LIORESAL) 10 MG tablet, Take 1 tablet by mouth 3 (three) times daily., Disp: , Rfl:    Cholecalciferol 25 MCG (1000 UT) capsule, Take by mouth., Disp: , Rfl:    diclofenac (VOLTAREN) 75 MG EC tablet, , Disp: , Rfl:    erythromycin ophthalmic ointment, , Disp: , Rfl:    glipiZIDE (GLUCOTROL) 10 MG tablet, , Disp: , Rfl:    ibuprofen (ADVIL) 800 MG tablet, , Disp: , Rfl:    ibuprofen (ADVIL) 800 MG tablet, Take 1 tablet (800 mg total) by mouth every 6 (six) hours as needed., Disp: 60 tablet, Rfl: 1   lisinopril (ZESTRIL) 2.5 MG tablet, , Disp: , Rfl:    methocarbamol (ROBAXIN) 500 MG tablet, , Disp: , Rfl:     oxyCODONE-acetaminophen (PERCOCET) 10-325 MG tablet, Take 1 tablet by mouth every 4 (four) hours as needed for pain., Disp: 30 tablet, Rfl: 0   oxyCODONE-acetaminophen (PERCOCET) 5-325 MG tablet, Take 1-2 tablets by mouth every 4 (four) hours as needed for severe pain., Disp: 30 tablet, Rfl: 0   oxyCODONE-acetaminophen (PERCOCET) 5-325 MG tablet, Take 1 tablet by mouth every 4 (four) hours as needed for severe pain., Disp: 30 tablet, Rfl: 0   pantoprazole (PROTONIX) 40 MG tablet, Take by mouth., Disp: , Rfl:    pioglitazone (ACTOS) 15 MG tablet, Take by mouth., Disp: , Rfl:    promethazine-dextromethorphan (PROMETHAZINE-DM) 6.25-15 MG/5ML syrup, , Disp: , Rfl:    traMADol (ULTRAM) 50 MG tablet, Take by mouth., Disp: , Rfl:   Social History   Tobacco Use  Smoking Status Not on file  Smokeless Tobacco Not on file    Allergies  Allergen Reactions   Metformin Other (See Comments)    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 There is no height or weight on file to calculate BMI. Constitutional Well developed. Well nourished.  Vascular Dorsalis pedis pulses palpable bilaterally. Posterior tibial pulses palpable bilaterally. Capillary refill normal to all digits.  No cyanosis or clubbing noted. Pedal hair growth normal.  Neurologic Normal speech. Oriented to person, place, and time. Epicritic sensation to light touch grossly present bilaterally.  Dermatologic Nails well groomed and normal in appearance. No open wounds. No skin lesions.  Orthopedic: Pain on palpation left Achilles posterior tendon.  Since indurated nodule without fluctuance noted to the posterior heel cord central aspect.  Pain with dorsiflexion of the ankle joint no pain with plantarflexion of the ankle joint.  No pain at the ATFL ligament, peroneal tendon, posterior tibial tendon.   Radiographs: None Assessment:    1. Achilles tendinitis, left leg   2. Bursitis of left ankle    Plan:  Patient was evaluated and treated and all questions answered.  Left Achilles tendinitis with underlying bursitis -I explained to the patient the etiology of bursitis and various treatment options were discussed.  Given the amount of pain that he is having I believe patient will benefit from a steroid injection.  I discussed with the patient that given the location of the bursa near the tendon and there is a risk of rupture associated with it.  Patient states understand like to proceed despite the risks. -A steroid injection was performed at left Achilles tendon using 1% plain Lidocaine and Kenalog 10 mg of Kenalog. This was well tolerated. -He will also place himself back in the cam boot. -If there is no improvement we will discuss MRI   No follow-ups on file.

## 2021-09-07 ENCOUNTER — Ambulatory Visit: Payer: Medicare PPO | Admitting: Podiatry

## 2021-09-28 ENCOUNTER — Ambulatory Visit: Payer: Medicare PPO | Admitting: Podiatry

## 2021-10-21 ENCOUNTER — Ambulatory Visit (INDEPENDENT_AMBULATORY_CARE_PROVIDER_SITE_OTHER): Payer: Medicare PPO | Admitting: Podiatry

## 2021-10-21 ENCOUNTER — Other Ambulatory Visit: Payer: Self-pay

## 2021-10-21 DIAGNOSIS — M7752 Other enthesopathy of left foot: Secondary | ICD-10-CM | POA: Diagnosis not present

## 2021-10-21 DIAGNOSIS — M7662 Achilles tendinitis, left leg: Secondary | ICD-10-CM

## 2021-10-24 ENCOUNTER — Telehealth: Payer: Self-pay | Admitting: Podiatry

## 2021-10-24 NOTE — Telephone Encounter (Signed)
Patient was seen on Friday and stated he was supposed to have a prescription sent to his pharmacy but they have not received it.

## 2021-10-25 MED ORDER — MELOXICAM 15 MG PO TABS: 15.0000 mg | ORAL_TABLET | Freq: Every day | ORAL | 0 refills | Status: AC

## 2021-10-25 NOTE — Telephone Encounter (Signed)
Called pt to let him know the Rx had been sent to his pharmacy and to call us with any issues or questions.

## 2021-10-25 NOTE — Progress Notes (Signed)
Subjective:  Patient ID: Brad Anderson, male    DOB: 1956/07/07,  MRN: [id number]  Chief Complaint  Patient presents with   Foot Pain    Left foot     66 y.o. male presents with the above complaint.  Patient presents with new complaint of left posterior Achilles bursa.  The mass is about the same still continues to be painful.  Injection did not help much.  He would like to discuss next treatment plan.  He would like to obtain MRI.  T   Review of Systems: Negative except as noted in the HPI. Denies N/V/F/Ch.  No past medical history on file.  Current Outpatient Medications:    aspirin 81 MG EC tablet, Take by mouth., Disp: , Rfl:    atorvastatin (LIPITOR) 40 MG tablet, Take by mouth., Disp: , Rfl:    baclofen (LIORESAL) 10 MG tablet, Take 1 tablet by mouth 3 (three) times daily., Disp: , Rfl:    Cholecalciferol 25 MCG (1000 UT) capsule, Take by mouth., Disp: , Rfl:    diclofenac (VOLTAREN) 75 MG EC tablet, , Disp: , Rfl:    erythromycin ophthalmic ointment, , Disp: , Rfl:    glipiZIDE (GLUCOTROL) 10 MG tablet, , Disp: , Rfl:    ibuprofen (ADVIL) 800 MG tablet, , Disp: , Rfl:    ibuprofen (ADVIL) 800 MG tablet, Take 1 tablet (800 mg total) by mouth every 6 (six) hours as needed., Disp: 60 tablet, Rfl: 1   lisinopril (ZESTRIL) 2.5 MG tablet, , Disp: , Rfl:    meloxicam (MOBIC) 15 MG tablet, Take 1 tablet (15 mg total) by mouth daily., Disp: 30 tablet, Rfl: 0   meloxicam (MOBIC) 15 MG tablet, Take 1 tablet (15 mg total) by mouth daily., Disp: 30 tablet, Rfl: 0   methocarbamol (ROBAXIN) 500 MG tablet, , Disp: , Rfl:    oxyCODONE-acetaminophen (PERCOCET) 10-325 MG tablet, Take 1 tablet by mouth every 4 (four) hours as needed for pain., Disp: 30 tablet, Rfl: 0   oxyCODONE-acetaminophen (PERCOCET) 5-325 MG tablet, Take 1-2 tablets by mouth every 4 (four) hours as needed for severe pain., Disp: 30 tablet, Rfl: 0   oxyCODONE-acetaminophen (PERCOCET) 5-325 MG tablet, Take 1 tablet by  mouth every 4 (four) hours as needed for severe pain., Disp: 30 tablet, Rfl: 0   pantoprazole (PROTONIX) 40 MG tablet, Take by mouth., Disp: , Rfl:    pioglitazone (ACTOS) 15 MG tablet, Take by mouth., Disp: , Rfl:    promethazine-dextromethorphan (PROMETHAZINE-DM) 6.25-15 MG/5ML syrup, , Disp: , Rfl:    traMADol (ULTRAM) 50 MG tablet, Take by mouth., Disp: , Rfl:   Social History   Tobacco Use  Smoking Status Not on file  Smokeless Tobacco Not on file    Allergies  Allergen Reactions   Metformin Other (See Comments)    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 There is no height or weight on file to calculate BMI. Constitutional Well developed. Well nourished.  Vascular Dorsalis pedis pulses palpable bilaterally. Posterior tibial pulses palpable bilaterally. Capillary refill normal to all digits.  No cyanosis or clubbing noted. Pedal hair growth normal.  Neurologic Normal speech. Oriented to person, place, and time. Epicritic sensation to light touch grossly present bilaterally.  Dermatologic Nails well groomed and normal in appearance. No open wounds. No skin lesions.  Orthopedic: Pain on palpation left Achilles posterior tendon.  Since indurated  nodule without fluctuance noted to the posterior heel cord central aspect.  Pain with dorsiflexion of the ankle joint no pain with plantarflexion of the ankle joint.  No pain at the ATFL ligament, peroneal tendon, posterior tibial tendon.   Radiographs: None Assessment:   No diagnosis found.  Plan:  Patient was evaluated and treated and all questions answered.  Left Achilles tendinitis with underlying bursitis -I explained to the patient the etiology of bursitis and various treatment options were discussed.  Given the amount of pain that he is having I believe patient will benefit from a steroid injection.  I discussed with the  patient that given the location of the bursa near the tendon and there is a risk of rupture associated with it.  Patient states understand like to proceed despite the risks. -I will hold off on any further steroid injection as there is a risk of rupture with overdoing any additional steroid. -Continue using cam boot -We will plan on ordering an MRI to assess the integrity of the Achilles tendon to rule out tearing.   No follow-ups on file.

## 2021-10-25 NOTE — Addendum Note (Signed)
Addended by: Nicholes Rough on: 10/25/2021 08:02 AM   Modules accepted: Orders

## 2021-11-13 ENCOUNTER — Other Ambulatory Visit: Payer: Self-pay

## 2021-11-13 ENCOUNTER — Ambulatory Visit
Admission: RE | Admit: 2021-11-13 | Discharge: 2021-11-13 | Disposition: A | Discharge status: Home / Self Care | Payer: Medicare PPO | Source: Ambulatory Visit | Attending: Podiatry | Admitting: Podiatry

## 2021-11-13 DIAGNOSIS — M7752 Other enthesopathy of left foot: Secondary | ICD-10-CM

## 2021-11-13 DIAGNOSIS — M7662 Achilles tendinitis, left leg: Secondary | ICD-10-CM

## 2021-11-23 ENCOUNTER — Other Ambulatory Visit: Payer: Self-pay

## 2021-11-23 ENCOUNTER — Ambulatory Visit: Payer: Medicare PPO | Admitting: Podiatry

## 2021-11-23 DIAGNOSIS — M7662 Achilles tendinitis, left leg: Secondary | ICD-10-CM | POA: Diagnosis not present

## 2021-11-23 DIAGNOSIS — M7752 Other enthesopathy of left foot: Secondary | ICD-10-CM

## 2021-11-23 NOTE — Progress Notes (Addendum)
Subjective:  Patient ID: Brad Anderson, male    DOB: 09/02/1956,  MRN: [id number]  Chief Complaint  Patient presents with   Foot Pain    Left foot MRI results     66 y.o. male presents with the above complaint.  Patient presents for follow-up to left posterior Achilles tendinitis/tendinosis.  He states his pain is doing much better.  The cam boot helped.  The injection helped somewhat.  He wanted to go over the MRI results.   Review of Systems: Negative except as noted in the HPI. Denies N/V/F/Ch.  No past medical history on file.  Current Outpatient Medications:    aspirin 81 MG EC tablet, Take by mouth., Disp: , Rfl:    atorvastatin (LIPITOR) 40 MG tablet, Take by mouth., Disp: , Rfl:    baclofen (LIORESAL) 10 MG tablet, Take 1 tablet by mouth 3 (three) times daily., Disp: , Rfl:    Cholecalciferol 25 MCG (1000 UT) capsule, Take by mouth., Disp: , Rfl:    diclofenac (VOLTAREN) 75 MG EC tablet, , Disp: , Rfl:    erythromycin ophthalmic ointment, , Disp: , Rfl:    glipiZIDE (GLUCOTROL) 10 MG tablet, , Disp: , Rfl:    ibuprofen (ADVIL) 800 MG tablet, , Disp: , Rfl:    ibuprofen (ADVIL) 800 MG tablet, Take 1 tablet (800 mg total) by mouth every 6 (six) hours as needed., Disp: 60 tablet, Rfl: 1   lisinopril (ZESTRIL) 2.5 MG tablet, , Disp: , Rfl:    meloxicam (MOBIC) 15 MG tablet, Take 1 tablet (15 mg total) by mouth daily., Disp: 30 tablet, Rfl: 0   meloxicam (MOBIC) 15 MG tablet, Take 1 tablet (15 mg total) by mouth daily., Disp: 30 tablet, Rfl: 0   methocarbamol (ROBAXIN) 500 MG tablet, , Disp: , Rfl:    oxyCODONE-acetaminophen (PERCOCET) 10-325 MG tablet, Take 1 tablet by mouth every 4 (four) hours as needed for pain., Disp: 30 tablet, Rfl: 0   oxyCODONE-acetaminophen (PERCOCET) 5-325 MG tablet, Take 1-2 tablets by mouth every 4 (four) hours as needed for severe pain., Disp: 30 tablet, Rfl: 0   oxyCODONE-acetaminophen (PERCOCET) 5-325 MG tablet, Take 1 tablet by mouth every 4  (four) hours as needed for severe pain., Disp: 30 tablet, Rfl: 0   pantoprazole (PROTONIX) 40 MG tablet, Take by mouth., Disp: , Rfl:    pioglitazone (ACTOS) 15 MG tablet, Take by mouth., Disp: , Rfl:    promethazine-dextromethorphan (PROMETHAZINE-DM) 6.25-15 MG/5ML syrup, , Disp: , Rfl:    traMADol (ULTRAM) 50 MG tablet, Take by mouth., Disp: , Rfl:   Social History   Tobacco Use  Smoking Status Not on file  Smokeless Tobacco Not on file    Allergies  Allergen Reactions   Metformin Other (See Comments)    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 There is no height or weight on file to calculate BMI. Constitutional Well developed. Well nourished.  Vascular Dorsalis pedis pulses palpable bilaterally. Posterior tibial pulses palpable bilaterally. Capillary refill normal to all digits.  No cyanosis or clubbing noted. Pedal hair growth normal.  Neurologic Normal speech. Oriented to person, place, and time. Epicritic sensation to light touch grossly present bilaterally.  Dermatologic Nails well groomed and normal in appearance. No open wounds. No skin lesions.  Orthopedic: Very mild pain on palpation left Achilles posterior tendon.  Since indurated nodule without fluctuance noted to  the posterior heel cord central aspect.  Very mild pain with dorsiflexion of the ankle joint no pain with plantarflexion of the ankle joint.  No pain at the ATFL ligament, peroneal tendon, posterior tibial tendon.   Radiographs: 1. 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 Mild associated degenerative change and marrow edema on both sides of the synchondrosis. 2. Prior Achilles tendon surgery. Moderate thickening of the Achilles tendon greatest approximally 5.5 cm above the distal tendon insertion. This involves tendinosis and a moderate partial-thickness midsubstance tear that is  predominantly a midsubstance tear but minimally contacts the posteromedial wall of the tendon at the distal aspect of the tear. No tendon retraction. 3. Mild attenuation of the anterior talofibular ligament which may represent the patient's normal anatomy versus remote partial-thickness tear Assessment:   1. Achilles tendinitis, left leg   2. Bursitis of left ankle     Plan:  Patient was evaluated and treated and all questions answered.  Left Achilles tendinitis with underlying bursitis -I explained to the patient the etiology of bursitis and various treatment options were discussed.  Given the amount of pain that he is having I believe patient will benefit from a steroid injection.  I discussed with the patient that given the location of the bursa near the tendon and there is a risk of rupture associated with it.  Patient states understand like to proceed despite the risks. -I will hold off on any further steroid injection as there is a risk of rupture with overdoing any additional steroid. -Continue using cam boot -MRI was reviewed with the patient in extensive detail given that his pain has improved considerably we will hold off on doing any kind of surgical intervention at this time.  If the pain were to recur we will plan on doing debridement of the Achilles tendon with a possible [facility] transfer   No follow-ups on file.

## 2022-03-24 ENCOUNTER — Ambulatory Visit: Payer: Medicare PPO | Admitting: Podiatry

## 2022-10-17 IMAGING — MR MR ANKLE*L* W/O CM
5 series · 40 of 40 positions shown · non-contrast
Comparison: Left foot radiographs 08/25/2020

CLINICAL DATA: Achilles tendinitis. Soft tissue mass. Left Achilles
mass for 3 months. Has increased in size. Foot feels cold. Prior
Achilles surgery in 1211.

EXAM:
MRI OF THE LEFT ANKLE WITHOUT CONTRAST
TECHNIQUE: Multiplanar, multisequence MR imaging of the ankle was performed. No
intravenous contrast was administered.

[Series 5: T1 · axial · 3.0mm · 0.50mm/px · z∈[-164,+7]mm · 10 of 45 slices shown (1 of 2)]
[im 1/45]
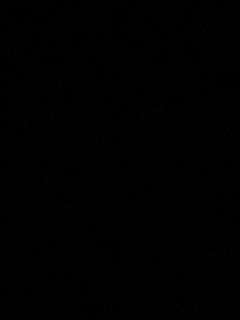
[im 5/45]
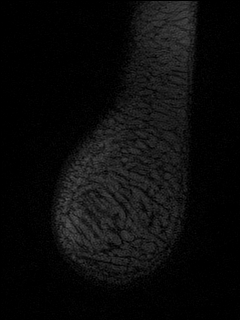
[im 10/45]
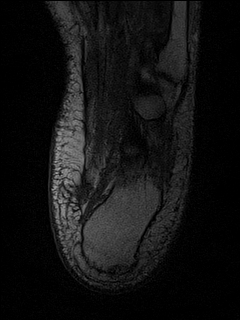
[im 15/45]
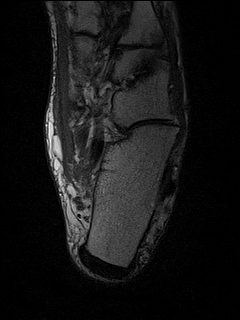
[im 20/45]
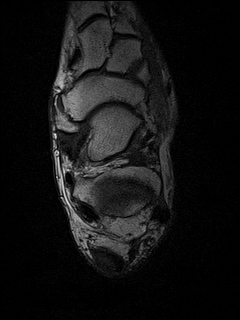
[im 25/45]
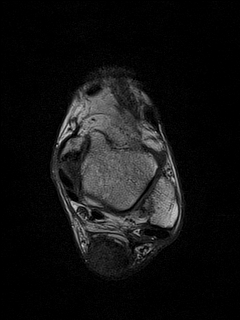
[im 30/45]
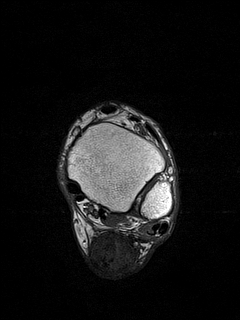
[im 35/45]
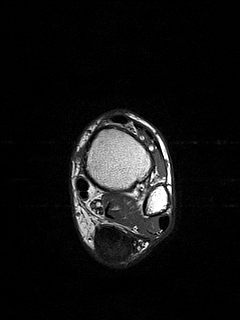
[im 40/45]
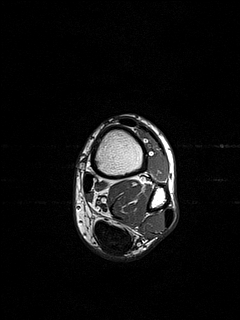
[im 45/45]
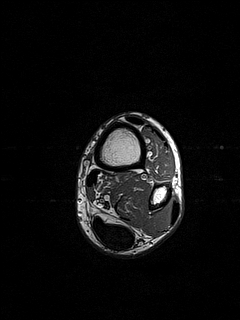

[Series 6: T2 fat-sat · axial · 3.0mm · 0.62mm/px · z∈[-165,+6]mm · 11 of 45 slices shown (1 of 2)]
[im 1/45]
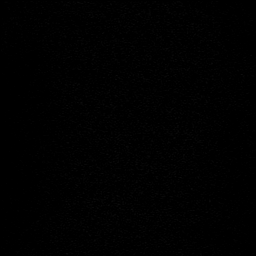
[im 5/45]
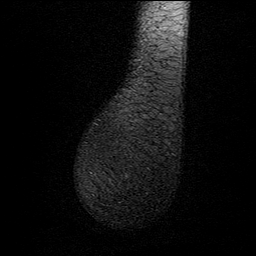
[im 9/45]
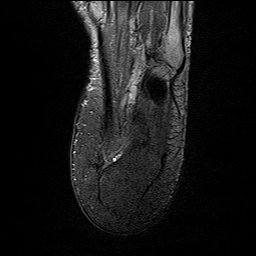
[im 14/45]
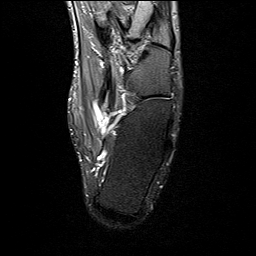
[im 18/45]
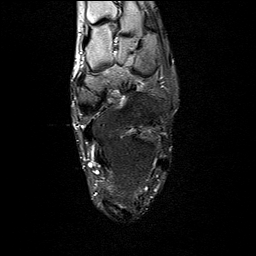
[im 23/45]
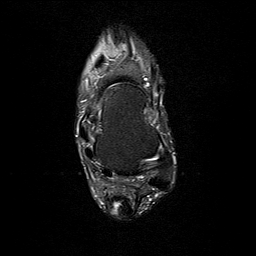
[im 27/45]
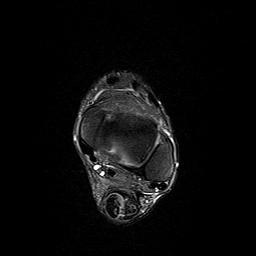
[im 31/45]
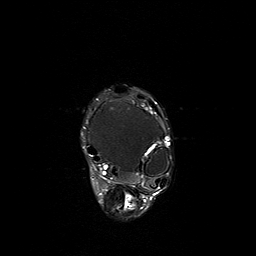
[im 36/45]
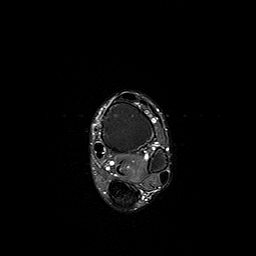
[im 40/45]
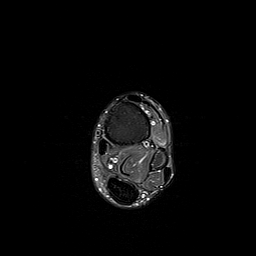
[im 45/45]
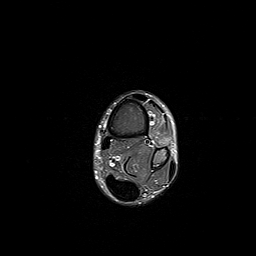

[Series 7: T1 · sagittal · 4.0mm · 0.56mm/px · 5 of 20 slices shown (2 of 2)]
[im 1/20]
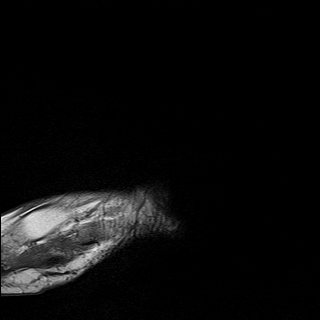
[im 5/20]
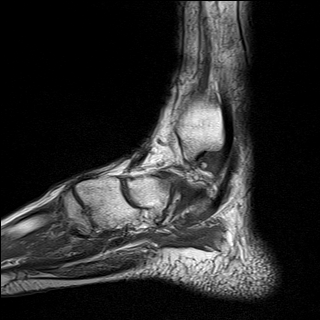
[im 10/20]
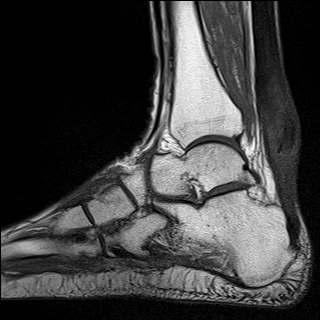
[im 15/20]
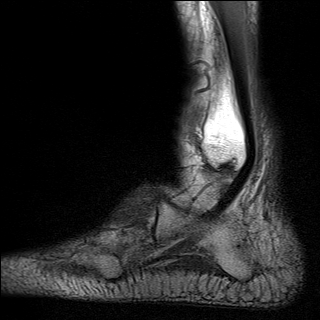
[im 20/20]
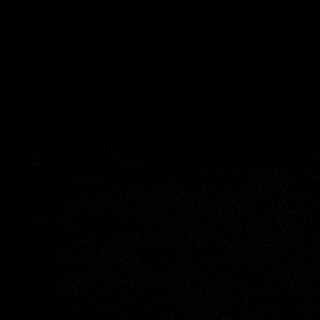

[Series 8: STIR · sagittal · 4.0mm · 0.35mm/px · 5 of 20 slices shown]
[im 1/20]
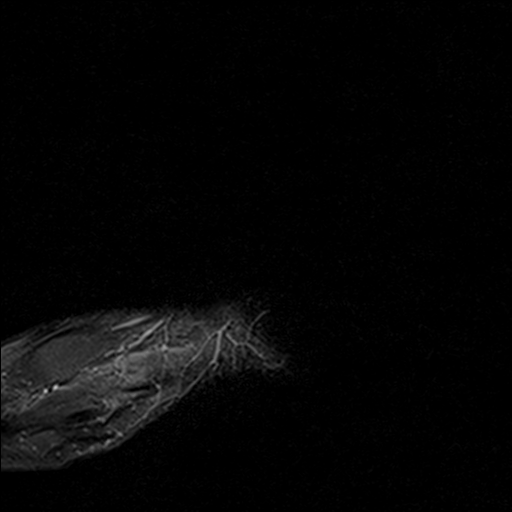
[im 5/20]
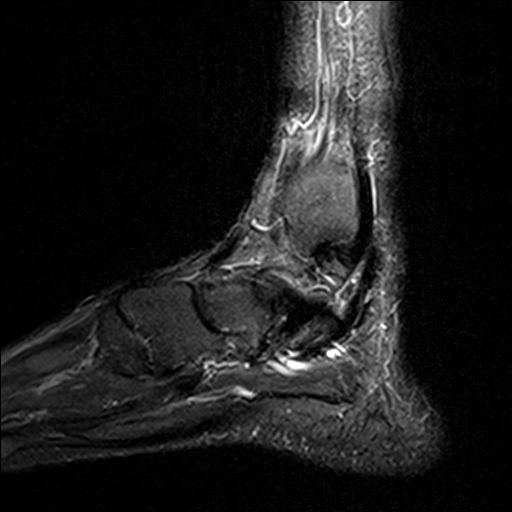
[im 10/20]
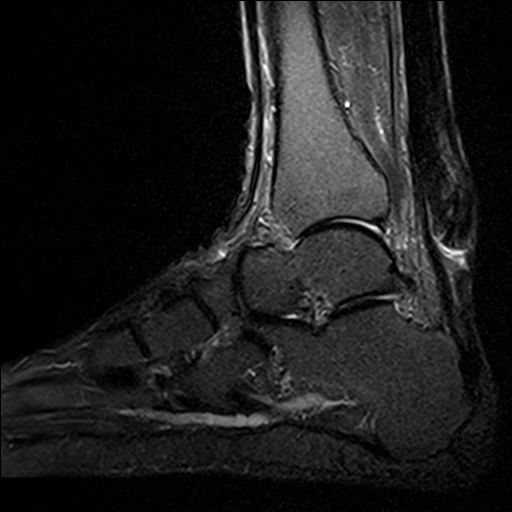
[im 15/20]
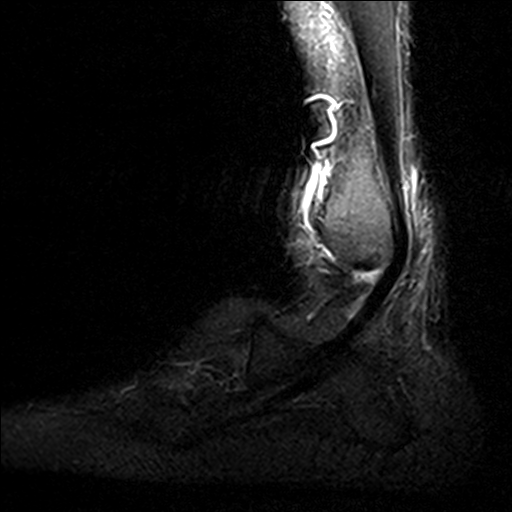
[im 20/20]
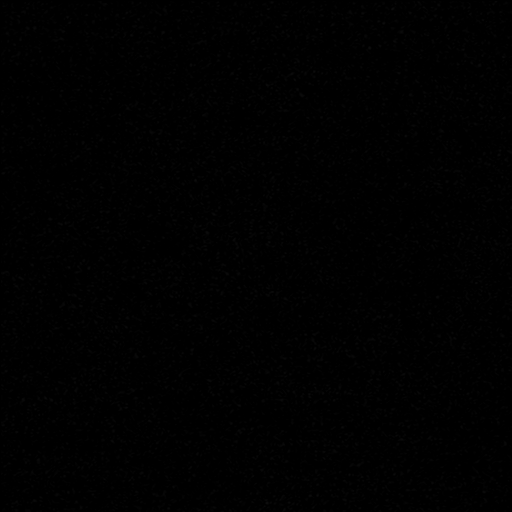

[Series 9: T2 fat-sat · coronal · 3.0mm · 0.70mm/px · 9 of 36 slices shown (2 of 2)]
[im 1/36]
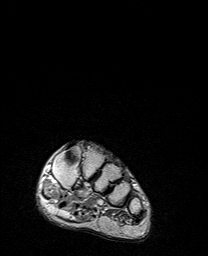
[im 5/36]
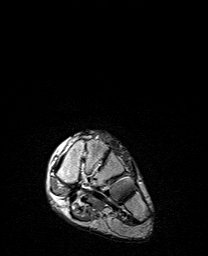
[im 9/36]
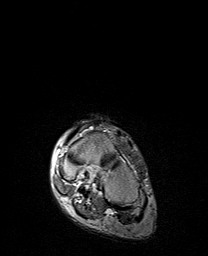
[im 14/36]
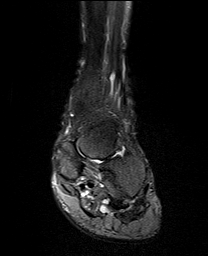
[im 18/36]
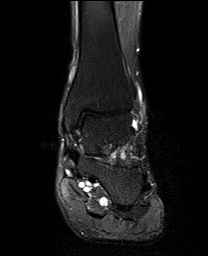
[im 22/36]
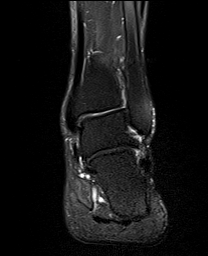
[im 27/36]
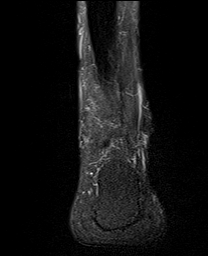
[im 31/36]
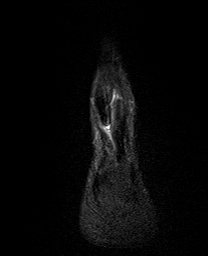
[im 36/36]
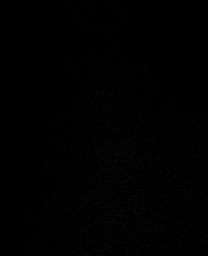

[40 of 40 positions shown; findings below may reference images not displayed]

FINDINGS: TENDONS

Peroneal: The peroneus longus and brevis tendons are intact.

Posteromedial: Posterior tibial tendon is intact. Minimal fluid
within the tendon sheath. There is a large type 2 os naviculare as
seen on prior radiographs. Mild associated subchondral marrow edema
and minimal degenerative subchondral cystic change at the distal
aspect especially on the lateral side of the os naviculare
synchondrosis.

The flexor digitorum longus and flexor hallucis longus tendons are
intact.

Anterior: The tibialis anterior, extensor hallucis longus and
extensor digitorum longus tendons are intact.

Achilles: Postsurgical changes are noted of the Achilles tendon.
There is moderate thickening of the avascular zone of the Achilles
tendon, greatest approximally 5.5 cm above the distal tendon
insertion on the calcaneus. At the area of greatest transverse
thickness, the tendon measures up to 2.4 x 2.2 cm (transverse by
AP). There is moderate intermediate T2 signal tendinosis in this
region and a moderate partial-thickness midsubstance tear (axial
images 14 through 24. At the superior aspect, this partial-thickness
tears within the midsubstance of the slightly lateral aspect of the
tendon (axial image 15). More distally this tear contacts the
posterior medial aspect of the tendon (axial image 23). No tendon
retraction. The abnormal thickening of the Achilles tendon spans an
approximate 7 cm craniocaudal dimension. Note is made that base and
Achilles tendon thickening is expected following prior Achilles
tendon surgery.

Plantar Fascia: Intact.

LIGAMENTS

Lateral: There is mild attenuation of the anterior talofibular
ligament which may represent the patient's normal anatomy versus a
remote partial-thickness tear. The calcaneofibular posterior
talofibular and anterior and posterior tibiofibular ligaments are
intact.

Medial: The tibiotalar deep deltoid and tibial spring ligaments are
intact.

CARTILAGE

Ankle Joint: Mild thinning of the anterior tibiotalar cartilage.

Subtalar Joints/Sinus Tarsi: Fat is preserved within sinus tarsi.

Bones: Normal marrow signal. No acute fracture.

Other: The Lisfranc ligament complex is intact.
IMPRESSION: :
IMPRESSION: 1. Large type 2 os naviculare as seen on prior radiographs. Mild
associated degenerative change and marrow edema on both sides of the
synchondrosis.
2. Prior Achilles tendon surgery. Moderate thickening of the
Achilles tendon greatest approximally 5.5 cm above the distal tendon
insertion. This involves tendinosis and a moderate partial-thickness
midsubstance tear that is predominantly a midsubstance tear but
minimally contacts the posteromedial wall of the tendon at the
distal aspect of the tear. No tendon retraction.
3. Mild attenuation of the anterior talofibular ligament which may
represent the patient's normal anatomy versus remote
partial-thickness tear.
